# Patient Record
Sex: Male | Born: 1960 | Race: White | Hispanic: No | Marital: Single | Smoking: Current every day smoker
Health system: Southern US, Community
[De-identification: ages and names within clinical notes are randomized; demographics above are authoritative.]

## PROBLEM LIST (undated history)

## (undated) DIAGNOSIS — Z9289 Personal history of other medical treatment: Secondary | ICD-10-CM

## (undated) DIAGNOSIS — J45909 Unspecified asthma, uncomplicated: Secondary | ICD-10-CM

## (undated) HISTORY — PX: COLONOSCOPY: SHX174

---

## 2014-10-12 ENCOUNTER — Emergency Department (HOSPITAL_COMMUNITY): Payer: Self-pay

## 2014-10-12 ENCOUNTER — Emergency Department (HOSPITAL_COMMUNITY)
Admission: EM | Admit: 2014-10-12 | Discharge: 2014-10-12 | Disposition: A | Discharge status: Home / Self Care | Payer: Self-pay | Attending: Emergency Medicine | Admitting: Emergency Medicine

## 2014-10-12 ENCOUNTER — Encounter (HOSPITAL_COMMUNITY): Payer: Self-pay

## 2014-10-12 DIAGNOSIS — R202 Paresthesia of skin: Secondary | ICD-10-CM

## 2014-10-12 DIAGNOSIS — B86 Scabies: Secondary | ICD-10-CM | POA: Insufficient documentation

## 2014-10-12 DIAGNOSIS — J45901 Unspecified asthma with (acute) exacerbation: Secondary | ICD-10-CM | POA: Insufficient documentation

## 2014-10-12 DIAGNOSIS — M7752 Other enthesopathy of left foot: Secondary | ICD-10-CM

## 2014-10-12 DIAGNOSIS — M779 Enthesopathy, unspecified: Secondary | ICD-10-CM | POA: Insufficient documentation

## 2014-10-12 DIAGNOSIS — R0602 Shortness of breath: Secondary | ICD-10-CM

## 2014-10-12 DIAGNOSIS — Z720 Tobacco use: Secondary | ICD-10-CM | POA: Insufficient documentation

## 2014-10-12 DIAGNOSIS — R21 Rash and other nonspecific skin eruption: Secondary | ICD-10-CM

## 2014-10-12 DIAGNOSIS — D649 Anemia, unspecified: Secondary | ICD-10-CM | POA: Insufficient documentation

## 2014-10-12 HISTORY — DX: Unspecified asthma, uncomplicated: J45.909

## 2014-10-12 HISTORY — DX: Personal history of other medical treatment: Z92.89

## 2014-10-12 LAB — BASIC METABOLIC PANEL
Anion gap: 9 (ref 5–15)
BUN: 14 mg/dL (ref 6–20)
CALCIUM: 9.2 mg/dL (ref 8.9–10.3)
CHLORIDE: 104 mmol/L (ref 101–111)
CO2: 26 mmol/L (ref 22–32)
Creatinine, Ser: 0.83 mg/dL (ref 0.61–1.24)
GFR calc Af Amer: >60 mL/min (ref >60)
GLUCOSE: 98 mg/dL (ref 65–99)
Potassium: 4 mmol/L (ref 3.5–5.1)
SODIUM: 139 mmol/L (ref 135–145)

## 2014-10-12 LAB — CBC
HEMATOCRIT: 34.3 % — AB (ref 39.0–52.0)
Hemoglobin: 10 g/dL — ABNORMAL LOW (ref 13.0–17.0)
MCH: 22.1 pg — ABNORMAL LOW (ref 26.0–34.0)
MCHC: 29.2 g/dL — AB (ref 30.0–36.0)
MCV: 75.9 fL — AB (ref 78.0–100.0)
Platelets: 251 10*3/uL (ref 150–400)
RBC: 4.52 MIL/uL (ref 4.22–5.81)
RDW: 16.4 % — ABNORMAL HIGH (ref 11.5–15.5)
WBC: 6.6 10*3/uL (ref 4.0–10.5)

## 2014-10-12 LAB — I-STAT TROPONIN, ED: TROPONIN I, POC: 0 ng/mL (ref 0.00–0.08)

## 2014-10-12 MED ORDER — PREDNISONE 20 MG PO TABS
60.0000 mg | ORAL_TABLET | Freq: Once | ORAL | Status: AC
  Administered 2014-10-12: 60 mg via ORAL
  Filled 2014-10-12: qty 3

## 2014-10-12 MED ORDER — HYDROCORTISONE 2.5 % EX CREA: TOPICAL_CREAM | Freq: Two times a day (BID) | CUTANEOUS | Status: AC | PRN

## 2014-10-12 MED ORDER — PERMETHRIN 5 % EX CREA: TOPICAL_CREAM | CUTANEOUS | Status: AC

## 2014-10-12 MED ORDER — FERROUS SULFATE 325 (65 FE) MG PO TABS: 325.0000 mg | ORAL_TABLET | Freq: Every day | ORAL | Status: AC

## 2014-10-12 MED ORDER — PREDNISONE 20 MG PO TABS
ORAL_TABLET | ORAL | Status: AC
Start: 2014-10-12 — End: ?

## 2014-10-12 NOTE — ED Notes (Signed)
Pt started feeling SOB earlier today and was exposed to poison ivy a week ago. Also reports numbness to his left foot.

## 2014-10-12 NOTE — ED Provider Notes (Signed)
CSN: [id number]     Arrival date & time 10/12/14  1803 History   First MD Initiated Contact with Patient 10/12/14 1833     Chief Complaint  Patient presents with  . Shortness of Breath  . Poison Ivy     (Consider location/radiation/quality/duration/timing/severity/associated sxs/prior Treatment) HPI Comments: Eugene Harrison is a 54 y.o. male with a PMHx of asthma and homelessness, who presents to the ED with complaints of multiple complaints. His primary complaint is left foot tightness along the anterior ankle. He describes this pain as 8/10, constant, radiating up the anterior calf, worse with wearing shoes, and improved with rest. He states that it feels as though there is some tingling, he states that this is been ongoing for "a long time" and he has had no recent trauma or injury to this leg. Additionally he is complaining of a rash to his torso and both arms, stating it is itchy and erythematous, began one week ago after he came into contact with a poison oak plant, and that he has not tried anything for his symptoms, with no known aggravating factors. Lastly he also stated to triage staff that he felt short of breath, but he denies this to me, stating that this happened earlier and lasted a few minutes but has resolved completely and he states that it was due to him smoking. He has no ongoing shortness of breath. He denies any recent trauma or injury, back pain, knee pain, joint swelling, fevers, chills, chest pain, cough, hemoptysis, wheezing, rhinorrhea or URI symptoms, lower extremity swelling, immobilization/travel/surgeries, history of DVT/PE, abdominal pain, nausea, vomiting, diarrhea, constipation, melena, hematochezia, dysuria, hematuria, numbness, or weakness. Denies any headache, vision changes, tremors, PND, orthopnea, or claudication. He is homeless and has been staying in a variety of places. Doesn't recall any specific contact with a similar rash to his.  Of note, 2 months ago he had a  colonoscopy for rectal bleeding, and had several complications after that (?perforation) which required him to need transfusions for acute blood loss. States that [facility] he has been well and has had no ongoing bleeding.   Patient is a 54 y.o. male presenting with poison ivy. The history is provided by the patient. No language interpreter was used.  Poison Lajoyce Cornersvy This is a new problem. The current episode started in the past 7 days. The problem occurs constantly. The problem has been unchanged. Associated symptoms include myalgias (L foot) and a rash. Pertinent negatives include no abdominal pain, arthralgias, chest pain, chills, coughing, fever, joint swelling, nausea, numbness, vomiting or weakness. Nothing aggravates the symptoms. He has tried nothing for the symptoms. The treatment provided no relief.    Past Medical History  Diagnosis Date  . History of blood transfusion   . Asthma    Past Surgical History  Procedure Laterality Date  . Colonoscopy     No family history on file. History  Substance Use Topics  . Smoking status: Current Every Day Smoker  . Smokeless tobacco: Not on file  . Alcohol Use: No    Review of Systems  Constitutional: Negative for fever and chills.  HENT: Negative for facial swelling and trouble swallowing.   Respiratory: Negative for cough, shortness of breath (only for a few minutes earlier, now resolved) and wheezing.   Cardiovascular: Negative for chest pain and leg swelling.  Gastrointestinal: Negative for nausea, vomiting, abdominal pain, diarrhea, constipation and blood in stool.  Genitourinary: Negative for dysuria and hematuria.  Musculoskeletal: Positive  for myalgias (L foot). Negative for back pain, joint swelling and arthralgias.  Skin: Positive for rash.  Allergic/Immunologic: Negative for immunocompromised state.  Neurological: Negative for weakness, light-headedness and numbness.  Psychiatric/Behavioral: Negative for  confusion.   10 Systems reviewed and are negative for acute change except as noted in the HPI.    Allergies  Review of patient's allergies indicates no known allergies.  Home Medications   Prior to Admission medications   Not on File   BP 115/60 mmHg  Pulse 73  Temp(Src) 98.1 F (36.7 C) (Oral)  Resp 18  Ht  (1.778 m)  Wt 172 lb 9.6 oz (78.291 kg)  BMI 24.77 kg/m2  SpO2 98% Physical Exam  Constitutional: He is oriented to person, place, and time. Vital signs are normal. He appears well-developed and well-nourished.  Non-toxic appearance. No distress.  Afebrile, nontoxic, NAD  HENT:  Head: Normocephalic and atraumatic.  Mouth/Throat: Oropharynx is clear and moist and mucous membranes are normal.  Eyes: Conjunctivae and EOM are normal. Right eye exhibits no discharge. Left eye exhibits no discharge.  Neck: Normal range of motion. Neck supple.  Cardiovascular: Normal rate, regular rhythm, normal heart sounds and intact distal pulses.  Exam reveals no gallop and no friction rub.   No murmur heard. RRR, nl s1/s2, no m/r/g, distal pulses intact, no pedal edema   Pulmonary/Chest: Effort normal and breath sounds normal. No respiratory distress. He has no decreased breath sounds. He has no wheezes. He has no rhonchi. He has no rales.  CTAB in all lung fields, no w/r/r, no hypoxia or increased WOB, speaking in full sentences, SpO2 98% on RA   Abdominal: Soft. Normal appearance and bowel sounds are normal. He exhibits no distension. There is no tenderness. There is no rigidity, no rebound, no guarding, no CVA tenderness, no tenderness at McBurney's point and negative Murphy's sign.  Musculoskeletal: Normal range of motion.       Left ankle: He exhibits normal range of motion, no swelling, no deformity and normal pulse. Tenderness (anterior tibialis tendon). Achilles tendon normal.  L ankle with FROM intact, no swelling or deformity, with mild TTP to anterior tibialis tendon but no  TTP or swelling of fore foot or calf, no bony TTP. No break in skin. No bruising or erythema. No warmth. No swelling. Achilles intact. Good pedal pulse and cap refill of all toes. Wiggling toes without difficulty. Sensation grossly intact. Gait steady. All spinal levels nonTTP without bony step offs. Neg SLR bilaterally. Neg log roll test. L hip and knee nonTTP with FROM intact.   Neurological: He is alert and oriented to person, place, and time. He has normal strength. No sensory deficit.  Skin: Skin is warm, dry and intact. Rash noted. Rash is vesicular.  Diffuse vesicular erythematous rash with excoriations noted to b/l arms and torso, some linear distribution but some scattered lesions, interdigital webspace involvement and some burrowing. No secondary cellulitis or weeping, no warmth or induration  Psychiatric: He has a normal mood and affect.  Nursing note and vitals reviewed.   ED Course  Procedures (including critical care time) Labs Review Labs Reviewed  CBC - Abnormal; Notable for the following:    Hemoglobin 10.0 (*)    HCT 34.3 (*)    MCV 75.9 (*)    MCH 22.1 (*)    MCHC 29.2 (*)    RDW 16.4 (*)    All other components within normal limits  BASIC METABOLIC PANEL  I-STAT TROPOININ, ED  Imaging Review Dg Chest 2 View  10/12/2014   CLINICAL DATA:  Shortness of Breath, poison ivy.  History of asthma.  EXAM: CHEST  2 VIEW  COMPARISON:  None.  FINDINGS: The heart size and mediastinal contours are within normal limits. Both lungs are clear. The visualized skeletal structures are unremarkable.  IMPRESSION: No active cardiopulmonary disease.   Electronically Signed   By: Charlett Nose M.D.   On: 10/12/2014 19:14     EKG Interpretation   Date/Time:  Saturday Oct 12 2014 18:18:03 EDT Ventricular Rate:  79 PR Interval:  136 QRS Duration: 76 QT Interval:  378 QTC Calculation: 433 R Axis:   82 Text Interpretation:  Normal sinus rhythm Normal ECG Sinus rhythm Artifact  Normal  ECG Confirmed by Gerhard Munch  MD ([id number]) on 10/12/2014 8:00:52  PM      MDM   Final diagnoses:  Rash  Scabies  Left ankle tendinitis  Paresthesia of left foot  SOB (shortness of breath)  Tobacco abuse  Anemia, unspecified anemia type    54 y.o. male here for multiple complaints. Primary complaint is L foot tightness and tingling over the tibialis anterior tendon, neurovascularly intact with soft compartments, no swelling. No trauma. Doubt need for imaging, likely this is from tendinitis from walking long distances since he's homeless. Secondary complaint is rash, states he was exposed to poison oak but some interdigital webspace involvement concerning for scabies. Will treat with both steroids and with permethrin cream. Lastly he reported SOB to triage, but he states to me that he does not have any SOB, he had some earlier this morning when he was smoking and it completely resolved after a few minutes. No ongoing SOB. No tachycardia or hypoxia, clear lung exam. Work up was started in triage, EKG unremarkable, Trop neg, CXR pending, CBC and BMP pending. Will await these and likely d/c home with permethrin, pred pack, hydrocortisone cream, and instruct on tylenol/motrin and heat therapy for pain. Will likely need to refer to [facility] for ongoing care.   8:35 PM CXR unremarkable. CBC remarkable for anemia, likely from his blood loss 2 months ago, has not been taking iron, will give this to him to go home with. BMP unremarkable. Strongly urged pt to stop smoking. Given instructions as listed previously. Will have him f/up with [facility]. I explained the diagnosis and have given explicit precautions to return to the ER including for any other new or worsening symptoms. The patient understands and accepts the medical plan as it's been dictated and I have answered their questions. Discharge instructions concerning home care and prescriptions have been given. The patient is STABLE and is discharged to home in  good condition.  BP 132/56 mmHg  Pulse 66  Temp(Src) 98.1 F (36.7 C) (Oral)  Resp 18  Ht  (1.778 m)  Wt 172 lb 9.6 oz (78.291 kg)  BMI 24.77 kg/m2  SpO2 96%  Meds ordered this encounter  Medications  . predniSONE (DELTASONE) tablet 60 mg    Sig:   . predniSONE (DELTASONE) 20 MG tablet    Sig: 3 tabs po daily x 4 days, then 2 tabs daily x 4 days, then 1 tab daily x 4 days    Dispense:  24 tablet    Refill:  0    Order Specific Question:  Supervising Provider    Answer:  MILLER, BRIAN [[id number]]  . hydrocortisone 2.5 % cream    Sig: Apply topically 2 (two) times daily as needed (itching).  Dispense:  15 g    Refill:  0    Order Specific Question:  Supervising Provider    Answer:  MILLER, BRIAN [[id number]]  . permethrin (ELIMITE) 5 % cream    Sig: Apply a generous amount of cream from head to feet, leave on for 8 to 14 hours, wash with soap/water    Dispense:  60 g    Refill:  0    Order Specific Question:  Supervising Provider    Answer:  Hyacinth Meeker, BRIAN [[id number]]  . ferrous sulfate 325 (65 FE) MG tablet    Sig: Take 1 tablet (325 mg total) by mouth daily with breakfast. TAKE WITH ORANGE JUICE    Dispense:  30 tablet    Refill:  0    Order Specific Question:  Supervising Provider    Answer:  Angus Seller Camprubi-Soms, PA-C 10/12/14 2036  Gerhard Munch, MD 10/12/14 2043

## 2014-10-12 NOTE — Discharge Instructions (Signed)
Take Prednisone and use hydrocortisone lotion as prescribed. Use permethrin cream as directed. Continue your usual home medications. Get plenty of rest and drink plenty of fluids. Avoid any known triggers for your rash. STOP SMOKING! Take iron supplement as directed for your anemia. Use tylenol and motrin as needed for your ankle pain, use heat to the area to help with pain. Please followup with [facility] after today's visit to have follow up and establish care. Return to the ER for changes or worsening symptoms.   Contact Dermatitis Contact dermatitis is a reaction to certain substances that touch the skin. Contact dermatitis can be either irritant contact dermatitis or allergic contact dermatitis. Irritant contact dermatitis does not require previous exposure to the substance for a reaction to occur.Allergic contact dermatitis only occurs if you have been exposed to the substance before. Upon a repeat exposure, your body reacts to the substance.  CAUSES  Many substances can cause contact dermatitis. Irritant dermatitis is most commonly caused by repeated exposure to mildly irritating substances, such as:  Makeup.  Soaps.  Detergents.  Bleaches.  Acids.  Metal salts, such as nickel. Allergic contact dermatitis is most commonly caused by exposure to:  Poisonous plants.  Chemicals (deodorants, shampoos).  Jewelry.  Latex.  Neomycin in triple antibiotic cream.  Preservatives in products, including clothing. SYMPTOMS  The area of skin that is exposed may develop:  Dryness or flaking.  Redness.  Cracks.  Itching.  Pain or a burning sensation.  Blisters. With allergic contact dermatitis, there may also be swelling in areas such as the eyelids, mouth, or genitals.  DIAGNOSIS  Your caregiver can usually tell what the problem is by doing a physical exam. In cases where the cause is uncertain and an allergic contact dermatitis is suspected, a patch skin  test may be performed to help determine the cause of your dermatitis. TREATMENT Treatment includes protecting the skin from further contact with the irritating substance by avoiding that substance if possible. Barrier creams, powders, and gloves may be helpful. Your caregiver may also recommend:  Steroid creams or ointments applied 2 times daily. For best results, soak the rash area in cool water for 20 minutes. Then apply the medicine. Cover the area with a plastic wrap. You can store the steroid cream in the refrigerator for a "chilly" effect on your rash. That may decrease itching. Oral steroid medicines may be needed in more severe cases.  Antibiotics or antibacterial ointments if a skin infection is present.  Antihistamine lotion or an antihistamine taken by mouth to ease itching.  Lubricants to keep moisture in your skin.  Burow's solution to reduce redness and soreness or to dry a weeping rash. Mix one packet or tablet of solution in 2 cups cool water. Dip a clean washcloth in the mixture, wring it out a bit, and put it on the affected area. Leave the cloth in place for 30 minutes. Do this as often as possible throughout the day.  Taking several cornstarch or baking soda baths daily if the area is too large to cover with a washcloth. Harsh chemicals, such as alkalis or acids, can cause skin damage that is like a burn. You should flush your skin for 15 to 20 minutes with cold water after such an exposure. You should also seek immediate medical care after exposure. Bandages (dressings), antibiotics, and pain medicine may be needed for severely irritated skin.  HOME CARE INSTRUCTIONS  Avoid the substance that caused your reaction.  Keep the area of skin that is affected away from hot water, soap, sunlight, chemicals, acidic substances, or anything else that would irritate your skin.  Do not scratch the rash. Scratching may cause the rash to become infected.  You may take cool baths to  help stop the itching.  Only take over-the-counter or prescription medicines as directed by your caregiver.  See your caregiver for follow-up care as directed to make sure your skin is healing properly. SEEK MEDICAL CARE IF:   Your condition is not better after 3 days of treatment.  You seem to be getting worse.  You see signs of infection such as swelling, tenderness, redness, soreness, or warmth in the affected area.  You have any problems related to your medicines. Document Released: 05/07/2000 Document Revised: 08/02/2011 Document Reviewed: 10/13/2010 [facility] Patient Information 2015 [place], [place]. This information is not intended to replace advice given to you by your health care provider. Make sure you discuss any questions you have with your health care provider.  Scabies Scabies are small bugs (mites) that burrow under the skin and cause red bumps and severe itching. These bugs can only be seen with a microscope. Scabies are highly contagious. They can spread easily from person to person by direct contact. They are also spread through sharing clothing or linens that have the scabies mites living in them. It is not unusual for an entire family to become infected through shared towels, clothing, or bedding.  HOME CARE INSTRUCTIONS   Your caregiver may prescribe a cream or lotion to kill the mites. If cream is prescribed, massage the cream into the entire body from the neck to the bottom of both feet. Also massage the cream into the scalp and face if your child is less than 19 year old. Avoid the eyes and mouth. Do not wash your hands after application.  Leave the cream on for 8 to 12 hours. Your child should bathe or shower after the 8 to 12 hour application period. Sometimes it is helpful to apply the cream to your child right before bedtime.  One treatment is usually effective and will eliminate approximately 95% of infestations. For severe cases, your caregiver may decide to  repeat the treatment in 1 week. Everyone in your household should be treated with one application of the cream.  New rashes or burrows should not appear within 24 to 48 hours after successful treatment. However, the itching and rash may last for 2 to 4 weeks after successful treatment. Your caregiver may prescribe a medicine to help with the itching or to help the rash go away more quickly.  Scabies can live on clothing or linens for up to 3 days. All of your child's recently used clothing, towels, stuffed toys, and bed linens should be washed in hot water and then dried in a dryer for at least 20 minutes on high heat. Items that cannot be washed should be enclosed in a plastic bag for at least 3 days.  To help relieve itching, bathe your child in a cool bath or apply cool washcloths to the affected areas.  Your child may return to school after treatment with the prescribed cream. SEEK MEDICAL CARE IF:   The itching persists longer than 4 weeks after treatment.  The rash spreads or becomes infected. Signs of infection include red blisters or yellow-tan crust. Document Released: 05/10/2005 Document Revised: 08/02/2011 Document Reviewed: 09/18/2008 [facility] Patient Information 2015 [place], [place]. This information is not intended to replace advice given  to you by your health care provider. Make sure you discuss any questions you have with your health care provider.  Repetitive Strain Injuries Repetitive strain injuries (RSIs) result from overuse or misuse of soft tissues including muscles, tendons, or nerves. Tendons are the cord-like structures that attach muscles to bones. RSIs can affect almost any part of the body. However, RSIs are most common in the arms (thumbs, wrists, elbows, shoulders) and legs (ankles, knees). Common medical conditions that are often caused by repetitive strain include carpal tunnel syndrome, tennis or golfer's elbow, bursitis, and tendonitis. If RSIs are treated early,  and therepeated activity is reduced or removed, the severity and length of your problems can usually be reduced. RSIs are also called cumulative trauma disorders (CTD).  CAUSES  Many RSIs occur due to repeating the same activity at work over weeks or months without sufficient rest, such as prolonged typing. RSIs also commonly occur when a hobby or sport is done repeatedly without sufficient rest. RSIs can also occur due to repeated strain or stress on a body part in someone who has one or more risk factors for RSIs. RISK FACTORS Workplace risk factors  Frequent computer use, especially if your workstation is not adjusted for your body type.  Infrequent rest breaks.  Working in a high-pressure environment.  Working at a [employer].  Repeating the same motion, such as frequent typing.  Working in an awkward position or holding the same position for a long time.  Forceful movements such as lifting, pulling, or pushing.  Vibration caused by using power tools.  Working in cold temperatures.  Job stress. Personal risk factors  Poor posture.  Being loose-jointed.  Not exercising regularly.  Being overweight.  Arthritis, diabetes, thyroid problems, or other long-term (chronic)medical conditions.  Vitamin deficiencies.  Keeping your fingernails long.  An unhealthy, stressful, or inactive lifestyle.  Not sleeping well. SYMPTOMS  Symptoms often begin at work but become more noticeable after the repeated stress has ended. For example, you may develop fatigue or soreness in your wrist while typingat work, and at night you may develop numbness and tingling in your fingers. Common symptoms include:   Burning, shooting, or aching pain, especially in the fingers, palms, wrists, forearms, or shoulders.  Tenderness.  Swelling.  Tingling, numbness, or loss of feeling.  Pain with certain activities, such as turning a doorknob or reaching above your head.  Weakness, heaviness,  or loss of coordination in yourhand.  Muscle spasms or tightness. In some cases, symptoms can become so intense that it is difficult to perform everyday tasks. Symptoms that do not improve with rest may indicate a more serious condition.  DIAGNOSIS  Your caregiver may determine the type ofRSI you have based on your medical evaluation and a description of your activities.  TREATMENT  Treatment depends on the severity and type of RSI you have. Your caregiver may recommend rest for the affected body part, medicines, and physical or occupational therapy to reduce pain, swelling, and soreness. Discuss the activities you do repeatedly with your caregiver. Your caregiver can help you decide whether you need to change your activities. An RSI may take months or years to heal, especially if the affected body part gets insufficient rest. In some cases, such as severe carpal tunnel syndrome, surgery may be recommended. PREVENTION  Talk with your supervisor to make sure you have the proper equipment for your work station.  Maintain good posture at your desk or work station with:  Feet flat on  the floor.  Knees directly over the feet, bent at a right angle.  Lower back supported by your chair or a cushion in the curve of your lower back.  Shoulders and arms relaxed and at your sides.  Neck relaxed and not bent forwards or backwards.  Your desk and computer workstation properly adjusted to your body type.  Your chair adjusted so there is no excess pressure on the back of your thighs.  The keyboard resting above your thighs. You should be able to reach the keys with your elbows at your side, bent at a right angle. Your arms should be supported on forearm rests, with your forearms parallel to the ground.  The computer mouse within easy reach.  The monitor directly in front of you, so that your eyes are aligned with the top of the screen. The screen should be about 15 to 25 inches from your  eyes.  While typing, keep your wrist straight, in a neutral position. Move your entire arm when you move your mouse or when typing hard-to-reach keys.  Only use your computer as much as you need to for work. Do not use it during breaks.  Take breaks often from any repeated activity. Alternate with another task which requires you to use different muscles, or rest at least once every hour.  Change positions regularly. If you spend a lot of time sitting, get up, walk around, and stretch.  Do not hold pens or pencils tightly when writing.  Exercise regularly.  Maintain a normal weight.  Eat a diet with plenty of vegetables, whole grains, and fruit.  Get sufficient, restful sleep. HOME CARE INSTRUCTIONS  If your caregiver prescribed medicine to help reduce swelling, take it as directed.  Only take over-the-counter or prescription medicines for pain, discomfort, or fever as directed by your caregiver.  Reduce, and if needed, stopthe activities that are causing your problems until you have no further symptoms.If your symptoms are work-related, you may need to talk to your supervisor about changing your activities.  When symptoms develop, put ice or a cold pack on the aching area.  Put ice in a plastic bag.  Place a towel between your skin and the bag.  Leave the ice on for 15-20 minutes.  If you were given a splint to keep your wrist from bending, wear it as instructed. It is important to wear the splint at night. Use the splint for as long as your caregiver recommends. SEEK MEDICAL CARE IF:  You develop new problems.  Your problems do not get better with medicine. MAKE SURE YOU:  Understand these instructions.  Will watch your condition.  Will get help right away if you are not doing well or get worse. Document Released: 04/30/2002 Document Revised: 11/09/2011 Document Reviewed: 07/01/2011 [facility] Patient Information 2015 [place]. This information is not intended  to replace advice given to you by your health care provider. Make sure you discuss any questions you have with your health care provider.  Paresthesia Paresthesia is a burning or prickling feeling. This feeling can happen in any part of the body. It often happens in the hands, arms, legs, or feet. HOME CARE  Avoid drinking alcohol.  Try massage or needle therapy (acupuncture) to help with your problems.  Keep all doctor visits as told. GET HELP RIGHT AWAY IF:   You feel weak.  You have trouble walking or moving.  You have problems speaking or seeing.  You feel confused.  You cannot control when  you poop (bowel movement) or pee (urinate).  You lose feeling (numbness) after an injury.  You pass out (faint).  Your burning or prickling feeling gets worse when you walk.  You have pain, cramps, or feel dizzy.  You have a rash. MAKE SURE YOU:   Understand these instructions.  Will watch your condition.  Will get help right away if you are not doing well or get worse. Document Released: 04/22/2008 Document Revised: 08/02/2011 Document Reviewed: 01/29/2011 [facility] Patient Information 2015 [place], [place]. This information is not intended to replace advice given to you by your health care provider. Make sure you discuss any questions you have with your health care provider.   Anemia, Nonspecific Anemia is a condition in which the concentration of red blood cells or hemoglobin in the blood is below normal. Hemoglobin is a substance in red blood cells that carries oxygen to the tissues of the body. Anemia results in not enough oxygen reaching these tissues.  CAUSES  Common causes of anemia include:   Excessive bleeding. Bleeding may be internal or external. This includes excessive bleeding from periods (in women) or from the intestine.   Poor nutrition.   Chronic kidney, thyroid, and liver disease.  Bone marrow disorders that decrease red blood cell production.  Cancer  and treatments for cancer.  HIV, AIDS, and their treatments.  Spleen problems that increase red blood cell destruction.  Blood disorders.  Excess destruction of red blood cells due to infection, medicines, and autoimmune disorders. SIGNS AND SYMPTOMS   Minor weakness.   Dizziness.   Headache.  Palpitations.   Shortness of breath, especially with exercise.   Paleness.  Cold sensitivity.  Indigestion.  Nausea.  Difficulty sleeping.  Difficulty concentrating. Symptoms may occur suddenly or they may develop slowly.  DIAGNOSIS  Additional blood tests are often needed. These help your health care provider determine the best treatment. Your health care provider will check your stool for blood and look for other causes of blood loss.  TREATMENT  Treatment varies depending on the cause of the anemia. Treatment can include:   Supplements of iron, vitamin B12, or folic acid.   Hormone medicines.   A blood transfusion. This may be needed if blood loss is severe.   Hospitalization. This may be needed if there is significant continual blood loss.   Dietary changes.  Spleen removal. HOME CARE INSTRUCTIONS Keep all follow-up appointments. It often takes many weeks to correct anemia, and having your health care provider check on your condition and your response to treatment is very important. SEEK IMMEDIATE MEDICAL CARE IF:   You develop extreme weakness, shortness of breath, or chest pain.   You become dizzy or have trouble concentrating.  You develop heavy vaginal bleeding.   You develop a rash.   You have bloody or black, tarry stools.   You faint.   You vomit up blood.   You vomit repeatedly.   You have abdominal pain.  You have a fever or persistent symptoms for more than 2-3 days.   You have a fever and your symptoms suddenly get worse.   You are dehydrated.  MAKE SURE YOU:  Understand these instructions.  Will watch your  condition.  Will get help right away if you are not doing well or get worse. Document Released: 06/17/2004 Document Revised: 01/10/2013 Document Reviewed: 11/03/2012 [facility] Patient Information 2015 [place], [place]. This information is not intended to replace advice given to you by your health care provider. Make sure you  discuss any questions you have with your health care provider.

## 2016-06-08 IMAGING — DX DG CHEST 2V
2 series · 2 of 2 positions shown · non-contrast
Comparison: None.

CLINICAL DATA: Shortness of Breath, poison ivy.  History of asthma.

EXAM:
CHEST  2 VIEW

[chest pa]
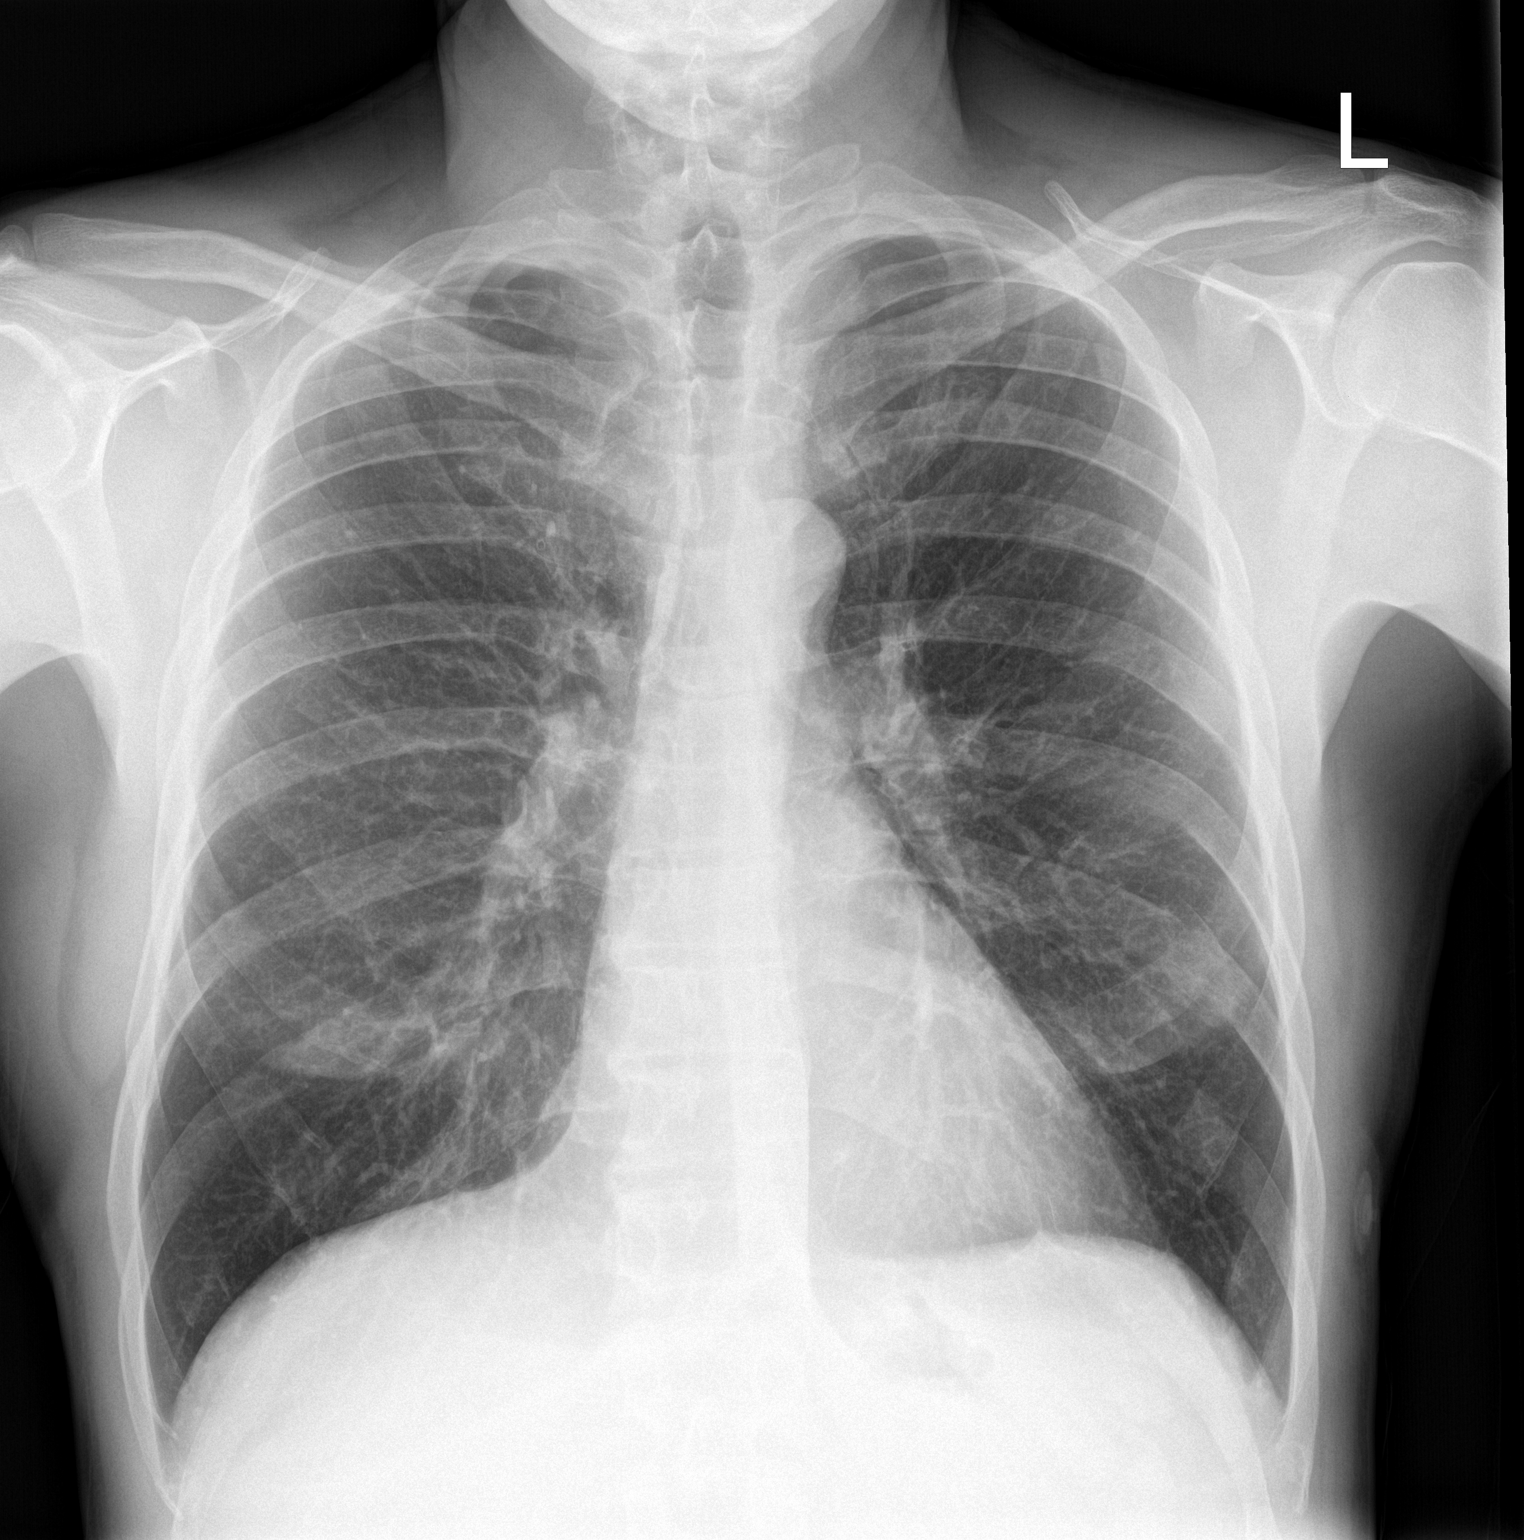

[chest lat]
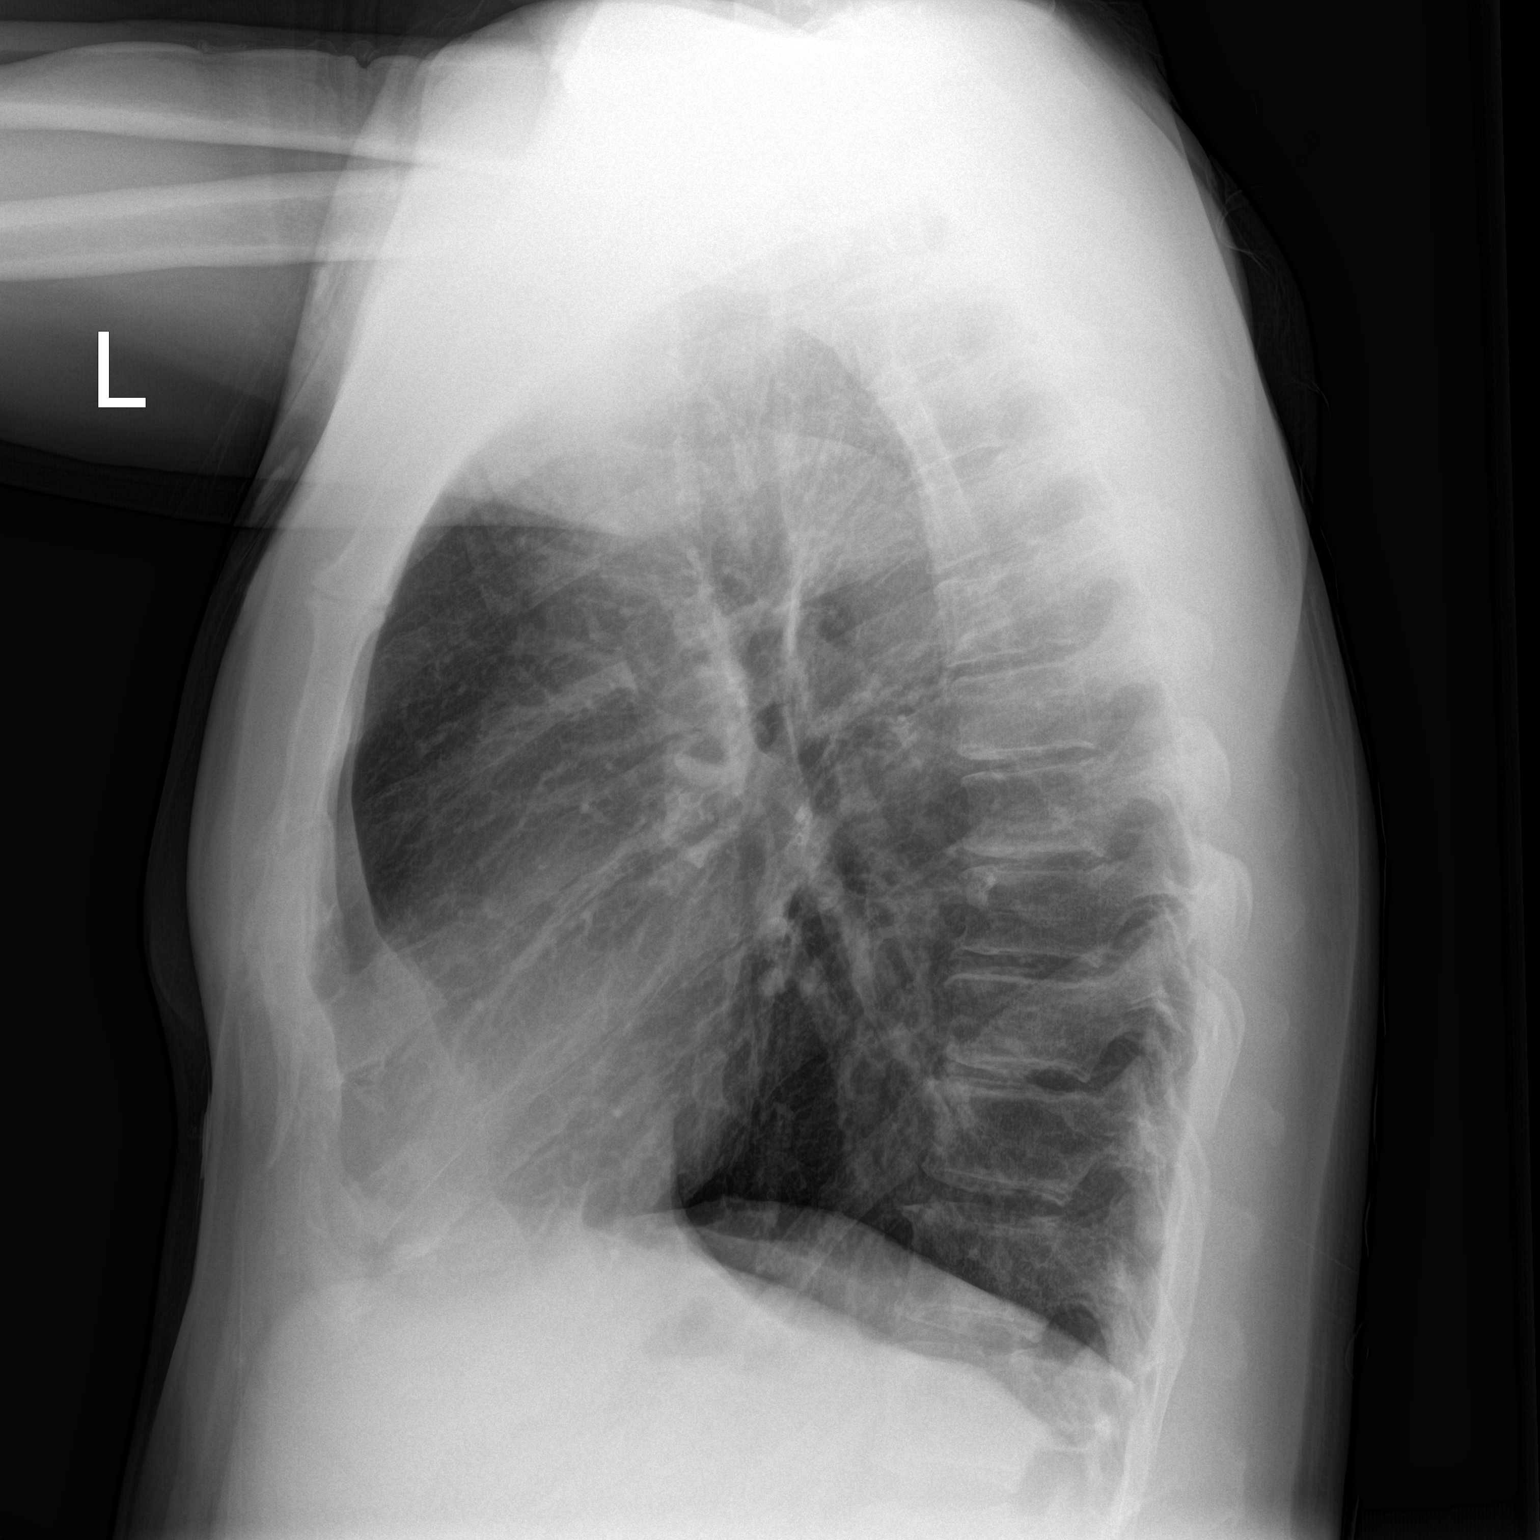

[2 of 2 positions shown; findings below may reference images not displayed]

FINDINGS: The heart size and mediastinal contours are within normal limits.
Both lungs are clear. The visualized skeletal structures are
unremarkable.
IMPRESSION: No active cardiopulmonary disease.
